# Patient Record
Sex: Female | Born: 1985
Health system: Southern US, Community
[De-identification: ages and names within clinical notes are randomized; demographics above are authoritative.]

## PROBLEM LIST (undated history)

## (undated) DIAGNOSIS — N809 Endometriosis, unspecified: Secondary | ICD-10-CM

## (undated) HISTORY — PX: DENTAL SURGERY: SHX609

---
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
## 2006-11-18 ENCOUNTER — Other Ambulatory Visit: Admission: RE | Admit: 2006-11-18 | Discharge: 2006-11-18 | Payer: Self-pay | Admitting: Gynecology

## 2010-08-03 NOTE — Procedures (Signed)
NAME:  Alexandra Burnett, Alexandra Burnett              ACCOUNT NO.:  [id number]   MEDICAL RECORD NO.:  [id number]          PATIENT TYPE:  OUT   LOCATION:  SLEEP CENTER                 FACILITY:  [facility]   PHYSICIAN:  Clinton D. Maple Hudson, M.D. DATE OF BIRTH:  1985/05/24   DATE OF ADMISSION:  10/04/2003  DATE OF DISCHARGE:  10/04/2003                              NOCTURNAL POLYSOMNOGRAM   REFERRING PHYSICIAN:  Dr. Hermelinda Medicus.   INDICATION FOR STUDY/HISTORY:  Hypersomnia with sleep apnea.  Reports of  tonsil hypertrophy.  Wakes fatigued, postnasal drainage and cough,  difficulty initiating sleep, excessive daytime sleepiness.  Epworth  Sleepiness Score 12/24.  BMI 19.2, weight 98 pounds.  Listed medication:  Oral contraceptive.   SLEEP ARCHITECTURE:  Total sleep time 376 minutes with sleep efficiency of  73%.  Stage I was 7%, stage II 68%, stage III and IV 11%.  REM was 14% of  total sleep.  Sleep latency 119 minutes.  REM latency 188.5 minutes.  Wake  after sleep onset 21 minutes.  Arousal index 8.6 times per hour.   RESPIRATORY DATA:  Occasional sleep disordered breathing including 13  hypopneas, RDI of 2 per hour, which is within normal limits and unlikely to  indicate a clinically significant sleep disordered breathing.  Most sleep  and most events while sleeping on left side.  REM RDI was 0 per hour.   OXYGEN DATA:  No snoring noted by technician, who reported she had to sit up  a couple of times to catch her breath before she feel asleep.  Oxygen  saturation nadir was 87% with mean oxygen saturation during sleep 98% and  room air baseline oxygen saturations while awake 98%.   CARDIAC DATA:  Normal cardiac rhythm.   MOVEMENT/PARASOMNIA:  Occasional leg jerks caused a few arousals,  insignificant.   IMPRESSION/RECOMMENDATION:  Occasional sleep disordered breathing events  with a respiratory disturbance index of 2 per hour, unlikely to be  clinically significant.  Oxygen saturation was generally  well-maintained.  A  few arousals were associated with leg jerks                                   ______________________________                                Rennis Chris. Maple Hudson, M.D.                                Diplomate, American Board of Sleep Medicine    CDY/MEDQ  D:  10/09/2003 12:40:09  T:  10/10/2003 11:16:00  Job:  [id number]

## 2011-12-06 ENCOUNTER — Encounter: Payer: Self-pay | Admitting: Internal Medicine

## 2016-02-17 ENCOUNTER — Emergency Department (HOSPITAL_COMMUNITY)
Admission: EM | Admit: 2016-02-17 | Discharge: 2016-02-17 | Disposition: A | Discharge status: Home / Self Care | Payer: BLUE CROSS/BLUE SHIELD | Attending: Emergency Medicine | Admitting: Emergency Medicine

## 2016-02-17 ENCOUNTER — Emergency Department (HOSPITAL_COMMUNITY): Payer: BLUE CROSS/BLUE SHIELD

## 2016-02-17 ENCOUNTER — Encounter (HOSPITAL_COMMUNITY): Payer: Self-pay | Admitting: Emergency Medicine

## 2016-02-17 DIAGNOSIS — W228XXA Striking against or struck by other objects, initial encounter: Secondary | ICD-10-CM | POA: Insufficient documentation

## 2016-02-17 DIAGNOSIS — Y999 Unspecified external cause status: Secondary | ICD-10-CM | POA: Diagnosis not present

## 2016-02-17 DIAGNOSIS — W19XXXA Unspecified fall, initial encounter: Secondary | ICD-10-CM

## 2016-02-17 DIAGNOSIS — F17200 Nicotine dependence, unspecified, uncomplicated: Secondary | ICD-10-CM | POA: Diagnosis not present

## 2016-02-17 DIAGNOSIS — S0990XA Unspecified injury of head, initial encounter: Secondary | ICD-10-CM | POA: Diagnosis present

## 2016-02-17 DIAGNOSIS — S0101XA Laceration without foreign body of scalp, initial encounter: Secondary | ICD-10-CM | POA: Insufficient documentation

## 2016-02-17 DIAGNOSIS — Y939 Activity, unspecified: Secondary | ICD-10-CM | POA: Diagnosis not present

## 2016-02-17 DIAGNOSIS — Y92480 Sidewalk as the place of occurrence of the external cause: Secondary | ICD-10-CM | POA: Insufficient documentation

## 2016-02-17 HISTORY — DX: Endometriosis, unspecified: N80.9

## 2016-02-17 MED ORDER — SODIUM CHLORIDE 0.9 % IV BOLUS (SEPSIS)
1000.0000 mL | Freq: Once | INTRAVENOUS | Status: AC
  Administered 2016-02-17: 1000 mL via INTRAVENOUS

## 2016-02-17 MED ORDER — LIDOCAINE-EPINEPHRINE 1 %-1:100000 IJ SOLN
10.0000 mL | Freq: Once | INTRAMUSCULAR | Status: AC
  Administered 2016-02-17: 1 mL via INTRADERMAL

## 2016-02-17 MED ORDER — ACETAMINOPHEN 500 MG PO TABS
1000.0000 mg | ORAL_TABLET | Freq: Once | ORAL | Status: AC
  Administered 2016-02-17: 1000 mg via ORAL

## 2016-02-17 NOTE — ED Provider Notes (Signed)
[facility]-EMERGENCY DEPT Provider Note   CSN: [id number] Arrival date & time: 02/17/16  0151  By signing my name below, I, Suzan SlickAshley N. Elon SpannerLeger, attest that this documentation has been prepared under the direction and in the presence of Alvira MondayErin Brynnly Bonet, MD.  Electronically Signed: Suzan SlickAshley N. Elon SpannerLeger, ED Scribe. 02/17/16. 3:06 AM.    History   Chief Complaint Chief Complaint  Patient presents with  . Loss of Consciousness  . Head Injury   The history is provided by the patient. No language interpreter was used.    HPI Comments: Alexandra Burnett, brought in by EMS is a 30 y.o. female without any pertinent past medical history who presents to the Emergency Department here after a fall sustained just prior to arrival. Pt states she does not recall events from the fall. However, EMS states pt fell on her front porch after drinking heavily this evening. Last thing pt remembers is being at the bar with her friends.  Boyfriend suspects she lost balance trying to open door and fell down one step.  She was found on the pavement in front of her house. She now reports constant, unchanged neck pain. Laceration also reported to the lateral head. Bleeding controlled at this time. No interventions given en route to department. No recent fever, chills, nausea, or vomiting. No numbness, loss of sensation, or weakness.  [facility]    Past Medical History:  Diagnosis Date  . Endometriosis     There are no active problems to display for this patient.   Past Surgical History:  Procedure Laterality Date  . DENTAL SURGERY      OB History    No data available       Home Medications    Prior to Admission medications   Medication Sig Start Date End Date Taking? Authorizing Provider  PRESCRIPTION MEDICATION Take 1 tablet by mouth daily. Birth control   Yes Historical Provider, MD  spironolactone (ALDACTONE) 25 MG tablet Take 25 mg by mouth 2 (two) times daily.   Yes Historical  Provider, MD    Family History History reviewed. No pertinent family history.  Social History Social History  Substance Use Topics  . Smoking status: Current Some Day Smoker  . Smokeless tobacco: Never Used  . Alcohol use Yes     Allergies   Other   Review of Systems Review of Systems  Constitutional: Negative for chills and fever.  Respiratory: Negative for cough and shortness of breath.   Cardiovascular: Negative for chest pain.  Gastrointestinal: Negative for nausea and vomiting.  Musculoskeletal: Positive for neck pain.  Skin: Positive for wound.  Neurological: Negative for weakness and numbness.  All other systems reviewed and are negative.    Physical Exam Updated Vital Signs BP 110/88   Pulse 108   Temp 97.5 F (36.4 C) (Oral)   Resp 26   Ht 5' (1.524 m)   Wt 107 lb (48.5 kg)   LMP 02/03/2016 (Approximate) Comment: Periods irregular.   SpO2 100%   BMI 20.90 kg/m   Physical Exam  Constitutional: She is oriented to person, place, and time. She appears well-developed and well-nourished. No distress.  HENT:  Head: Normocephalic. Head is with laceration. Head is without raccoon's eyes and without Battle's sign.  Nose: No rhinorrhea.  4 cm lateral laceration to the R parietal area. No CSF.  Eyes: EOM are normal.  Neck: Normal range of motion.  Cardiovascular: Normal rate, regular rhythm and normal heart sounds.  Pulmonary/Chest: Effort normal and breath sounds normal.  Abdominal: Soft. She exhibits no distension. There is no tenderness.  Musculoskeletal: Normal range of motion. She exhibits tenderness.  Midline cervical spine tenderness.  Neurological: She is alert and oriented to person, place, and time.  Skin: Skin is warm and dry.  Psychiatric: She has a normal mood and affect. Judgment normal.  Nursing note and vitals reviewed.    ED Treatments / Results   DIAGNOSTIC STUDIES: Oxygen Saturation is 100% on RA, Normal by my interpretation.     COORDINATION OF CARE: 3:03 AM- Will order imaging. Discussed treatment plan with pt at bedside and pt agreed to plan.     Labs (all labs ordered are listed, but only abnormal results are displayed) Labs Reviewed - No data to display  EKG  EKG Interpretation None       Radiology Ct Head Wo Contrast  Result Date: 02/17/2016 CLINICAL DATA:  Fall, hit head on concrete, pain EXAM: CT HEAD WITHOUT CONTRAST CT CERVICAL SPINE WITHOUT CONTRAST TECHNIQUE: Multidetector CT imaging of the head and cervical spine was performed following the standard protocol without intravenous contrast. Multiplanar CT image reconstructions of the cervical spine were also generated. COMPARISON:  None. FINDINGS: CT HEAD FINDINGS Brain: No evidence of acute infarction, hemorrhage, hydrocephalus, extra-axial collection or mass lesion/mass effect. Vascular: No hyperdense vessel or unexpected calcification. Skull: Normal. Negative for fracture or focal lesion. Sinuses/Orbits: No acute finding. Other: Right posterior parietal scalp laceration. CT CERVICAL SPINE FINDINGS Alignment: No subluxation.  Facet alignment is maintained. Skull base and vertebrae: Craniovertebral junction is intact. Vertebral body heights are maintained. No fracture. Soft tissues and spinal canal: No prevertebral fluid or swelling. No visible canal hematoma. Disc levels:  No significant disc disease. Upper chest: Lung apices clear.  Thyroid normal Other: None IMPRESSION: No CT evidence for acute intracranial abnormality. Right posterior parietal scalp laceration. No CT evidence for acute osseous abnormality of the cervical spine Electronically Signed   By: Jasmine Pang M.D.   On: 02/17/2016 04:45   Ct Cervical Spine Wo Contrast  Result Date: 02/17/2016 CLINICAL DATA:  Fall, hit head on concrete, pain EXAM: CT HEAD WITHOUT CONTRAST CT CERVICAL SPINE WITHOUT CONTRAST TECHNIQUE: Multidetector CT imaging of the head and cervical spine was performed  following the standard protocol without intravenous contrast. Multiplanar CT image reconstructions of the cervical spine were also generated. COMPARISON:  None. FINDINGS: CT HEAD FINDINGS Brain: No evidence of acute infarction, hemorrhage, hydrocephalus, extra-axial collection or mass lesion/mass effect. Vascular: No hyperdense vessel or unexpected calcification. Skull: Normal. Negative for fracture or focal lesion. Sinuses/Orbits: No acute finding. Other: Right posterior parietal scalp laceration. CT CERVICAL SPINE FINDINGS Alignment: No subluxation.  Facet alignment is maintained. Skull base and vertebrae: Craniovertebral junction is intact. Vertebral body heights are maintained. No fracture. Soft tissues and spinal canal: No prevertebral fluid or swelling. No visible canal hematoma. Disc levels:  No significant disc disease. Upper chest: Lung apices clear.  Thyroid normal Other: None IMPRESSION: No CT evidence for acute intracranial abnormality. Right posterior parietal scalp laceration. No CT evidence for acute osseous abnormality of the cervical spine Electronically Signed   By: Jasmine Pang M.D.   On: 02/17/2016 04:45    Procedures .Marland KitchenLaceration Repair Date/Time: 02/17/2016 4:56 PM Performed by: Alvira Monday Authorized by: Alvira Monday   Consent:    Consent obtained:  Verbal   Risks discussed:  Infection and pain Laceration details:    Location:  Scalp   Scalp location:  R parietal   Length (cm):  4 Repair type:    Repair type:  Simple Pre-procedure details:    Preparation:  Patient was prepped and draped in usual sterile fashion and imaging obtained to evaluate for foreign bodies Exploration:    Contaminated: no   Treatment:    Area cleansed with:  Saline (chloraprep)   Amount of cleaning:  Standard   Irrigation solution:  Sterile saline   Irrigation volume:  1L   Irrigation method:  Syringe Skin repair:    Repair method:  Staples   Number of staples:  7 Post-procedure  details:    Dressing:  Open (no dressing)   Patient tolerance of procedure:  Tolerated well, no immediate complications   (including critical care time)  Medications Ordered in ED Medications  sodium chloride 0.9 % bolus 1,000 mL (0 mLs Intravenous Stopped 02/17/16 0530)  lidocaine-EPINEPHrine (XYLOCAINE W/EPI) 1 %-1:100000 (with pres) injection 10 mL (1 mL Intradermal Given 02/17/16 0630)  acetaminophen (TYLENOL) tablet 1,000 mg (1,000 mg Oral Given 02/17/16 0630)     Initial Impression / Assessment and Plan / ED Course  I have reviewed the triage vital signs and the nursing notes.  Pertinent labs & imaging results that were available during my care of the patient were reviewed by me and considered in my medical decision making (see chart for details).  Clinical Course     30yo female with hx of binge drinking presents with concern for fall with LOC just prior to arrival and head laceration. CT head and cervical spine WNL. Laceration irrigated, repaired with staples.  Pt without other injuries. Given fluids, pt stable for discharge. Patient discharged in stable condition with understanding of reasons to return.   Final Clinical Impressions(s) / ED Diagnoses   Final diagnoses:  Fall, initial encounter  Laceration of scalp, initial encounter    New Prescriptions Discharge Medication List as of 02/17/2016  6:23 AM     I personally performed the services described in this documentation, which was scribed in my presence. The recorded information has been reviewed and is accurate.    Alvira MondayErin Fateh Kindle, MD 02/17/16 [phone]

## 2016-02-17 NOTE — ED Notes (Signed)
Pt ambulatory to bathroom with steady gait. Denied dizziness during ambulation

## 2016-02-17 NOTE — ED Triage Notes (Signed)
Arrives via EMS after falling on front porch after drinking heavily tonight. Last thing patient remembers was being at the bar with friends. Per boyfriend, patient called him and explained that she had taken an [employer] home and then patient stated "oh shit" over the phone; afterward phone went silent. Boyfriend went to house and found patient lying on pavement. EMS reported small amount of blood on pavement. Laceration to right posterior lateral head. Bleeding controlled at this time.

## 2016-02-17 NOTE — ED Notes (Signed)
Lido at bedside

## 2016-02-17 NOTE — ED Notes (Signed)
Patient ambulated to restroom with this RN and Boyfriend. Gait steady. Denies dizziness. After returning, CT transporter arrived.

## 2016-02-17 NOTE — ED Notes (Signed)
Pt verbalized understanding of d/c instructions and has no further questions. Pt is stable, A&Ox4, VSS.  

## 2017-06-11 DIAGNOSIS — R69 Illness, unspecified: Secondary | ICD-10-CM | POA: Diagnosis not present

## 2017-08-07 DIAGNOSIS — Z3041 Encounter for surveillance of contraceptive pills: Secondary | ICD-10-CM | POA: Diagnosis not present

## 2017-08-07 DIAGNOSIS — Z01419 Encounter for gynecological examination (general) (routine) without abnormal findings: Secondary | ICD-10-CM | POA: Diagnosis not present

## 2017-08-07 DIAGNOSIS — Z1389 Encounter for screening for other disorder: Secondary | ICD-10-CM | POA: Diagnosis not present

## 2017-08-07 DIAGNOSIS — R69 Illness, unspecified: Secondary | ICD-10-CM | POA: Diagnosis not present

## 2017-08-07 DIAGNOSIS — N803 Endometriosis of pelvic peritoneum: Secondary | ICD-10-CM | POA: Diagnosis not present

## 2017-08-07 DIAGNOSIS — Z130 Encounter for screening for diseases of the blood and blood-forming organs and certain disorders involving the immune mechanism: Secondary | ICD-10-CM | POA: Diagnosis not present

## 2017-08-07 DIAGNOSIS — Z6820 Body mass index (BMI) 20.0-20.9, adult: Secondary | ICD-10-CM | POA: Diagnosis not present

## 2017-08-15 DIAGNOSIS — L249 Irritant contact dermatitis, unspecified cause: Secondary | ICD-10-CM | POA: Diagnosis not present

## 2017-10-07 DIAGNOSIS — M79672 Pain in left foot: Secondary | ICD-10-CM | POA: Diagnosis not present

## 2017-12-25 DIAGNOSIS — D2262 Melanocytic nevi of left upper limb, including shoulder: Secondary | ICD-10-CM | POA: Diagnosis not present

## 2017-12-25 DIAGNOSIS — D2371 Other benign neoplasm of skin of right lower limb, including hip: Secondary | ICD-10-CM | POA: Diagnosis not present

## 2017-12-25 DIAGNOSIS — D224 Melanocytic nevi of scalp and neck: Secondary | ICD-10-CM | POA: Diagnosis not present

## 2017-12-25 DIAGNOSIS — L821 Other seborrheic keratosis: Secondary | ICD-10-CM | POA: Diagnosis not present

## 2017-12-25 DIAGNOSIS — D2261 Melanocytic nevi of right upper limb, including shoulder: Secondary | ICD-10-CM | POA: Diagnosis not present

## 2017-12-25 DIAGNOSIS — L720 Epidermal cyst: Secondary | ICD-10-CM | POA: Diagnosis not present

## 2017-12-25 DIAGNOSIS — D225 Melanocytic nevi of trunk: Secondary | ICD-10-CM | POA: Diagnosis not present

## 2017-12-25 DIAGNOSIS — D2222 Melanocytic nevi of left ear and external auricular canal: Secondary | ICD-10-CM | POA: Diagnosis not present

## 2017-12-25 DIAGNOSIS — D2272 Melanocytic nevi of left lower limb, including hip: Secondary | ICD-10-CM | POA: Diagnosis not present

## 2017-12-25 DIAGNOSIS — D2271 Melanocytic nevi of right lower limb, including hip: Secondary | ICD-10-CM | POA: Diagnosis not present

## 2018-01-29 DIAGNOSIS — R69 Illness, unspecified: Secondary | ICD-10-CM | POA: Diagnosis not present

## 2018-02-19 DIAGNOSIS — R69 Illness, unspecified: Secondary | ICD-10-CM | POA: Diagnosis not present

## 2018-04-22 DIAGNOSIS — J019 Acute sinusitis, unspecified: Secondary | ICD-10-CM | POA: Diagnosis not present

## 2018-05-05 DIAGNOSIS — R05 Cough: Secondary | ICD-10-CM | POA: Diagnosis not present

## 2018-05-29 DIAGNOSIS — R69 Illness, unspecified: Secondary | ICD-10-CM | POA: Diagnosis not present

## 2018-05-29 DIAGNOSIS — J0100 Acute maxillary sinusitis, unspecified: Secondary | ICD-10-CM | POA: Diagnosis not present

## 2018-05-29 DIAGNOSIS — R05 Cough: Secondary | ICD-10-CM | POA: Diagnosis not present

## 2018-06-03 IMAGING — CT CT CERVICAL SPINE W/O CM
5 of 8 series · 11 of 33 positions shown, 12 images · non-contrast
Comparison: None.

CLINICAL DATA: Fall, hit head on concrete, pain

EXAM:
CT HEAD WITHOUT CONTRAST
CT CERVICAL SPINE WITHOUT CONTRAST
TECHNIQUE: Multidetector CT imaging of the head and cervical spine was
performed following the standard protocol without intravenous
contrast. Multiplanar CT image reconstructions of the cervical spine
were also generated.

[Series 3: head bone · axial · 0.41mm/px · z∈[-16,+38]mm · 2 of 82 slices shown]
[im 28/82  bone]
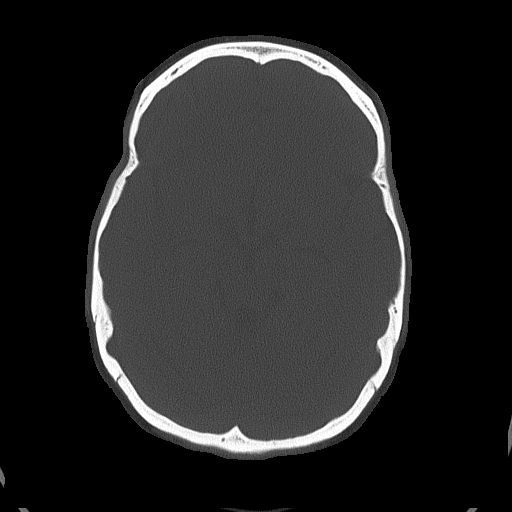
[im 55/82  bone]
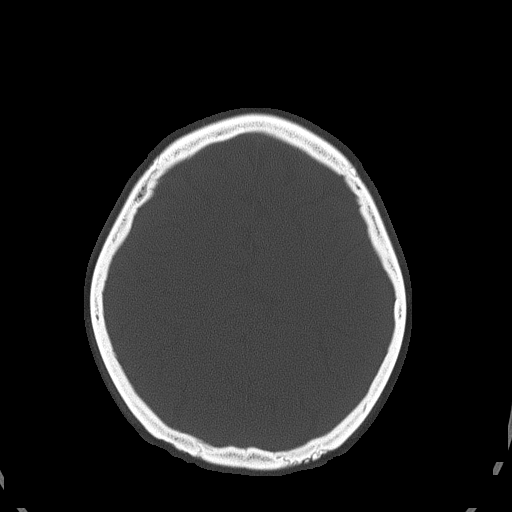

[Series 6: c_spine 2.0 st · axial · 0.25mm/px · z∈[-164,-114]mm · 2 of 77 slices shown, 3 images]
[im 26/77  soft-tissue]
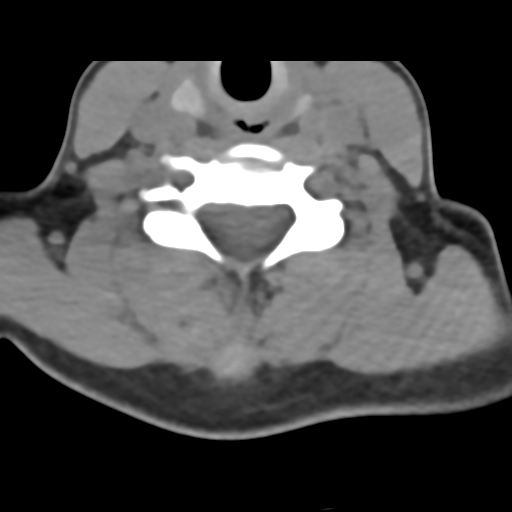
[im 26/77  bone]
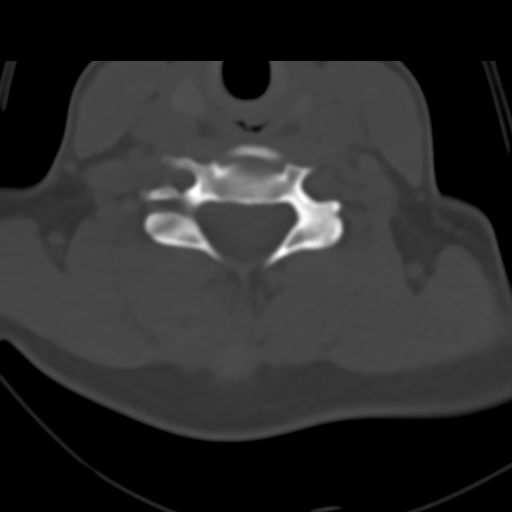
[im 51/77  bone]
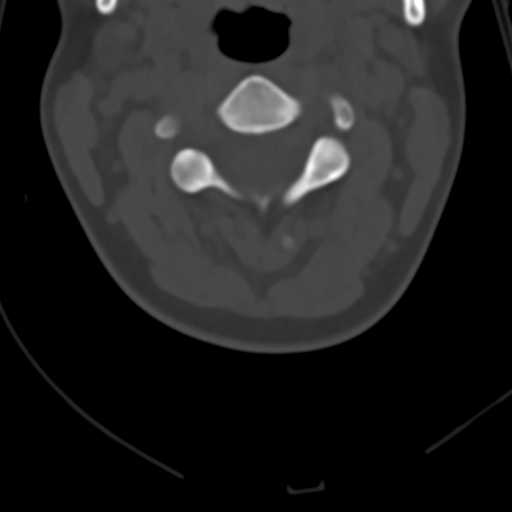

[Series 10: c_spine 2.0 sag bone · sagittal · 0.23mm/px · 4 of 61 slices shown]
[im 13/61  bone]
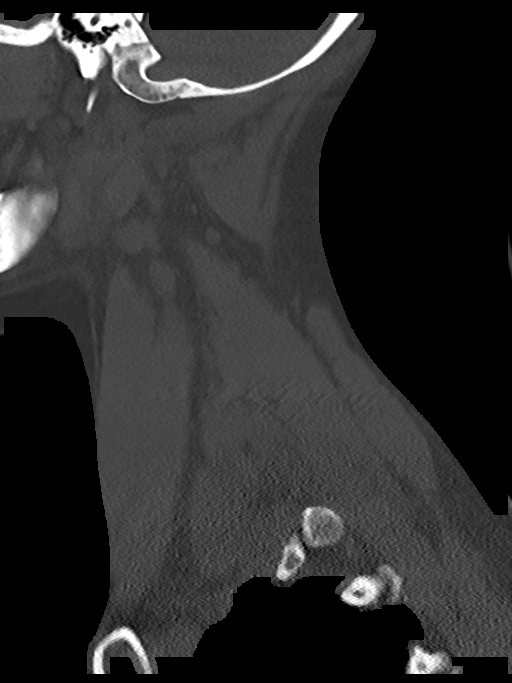
[im 25/61  bone]
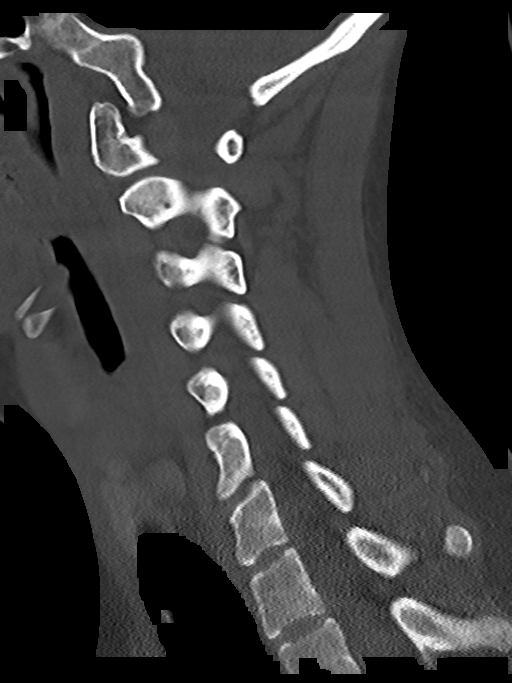
[im 37/61  bone]
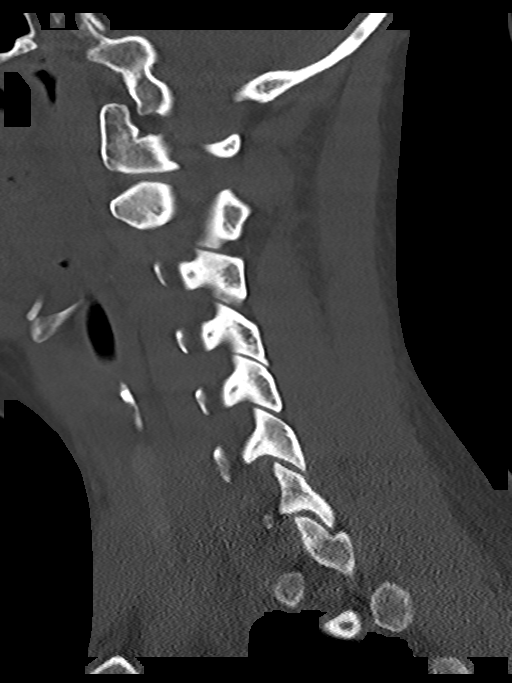
[im 49/61  bone]
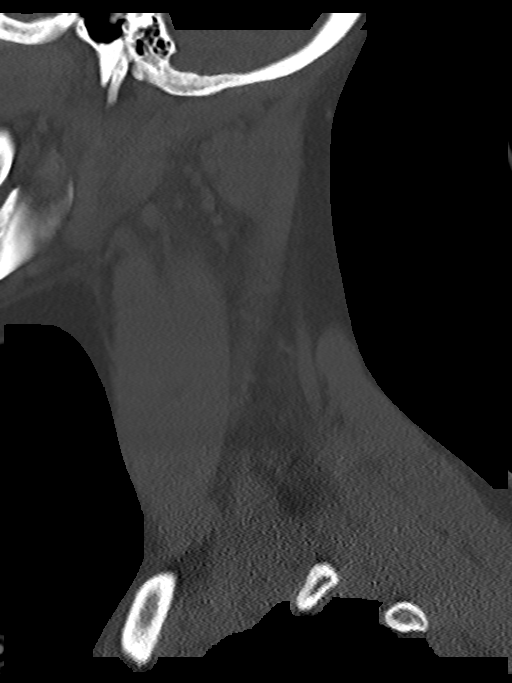

[Series 11: c_spine 2.0 cor bone · coronal · 0.23mm/px · 1 of 61 slices shown]
[im 31/61  bone]
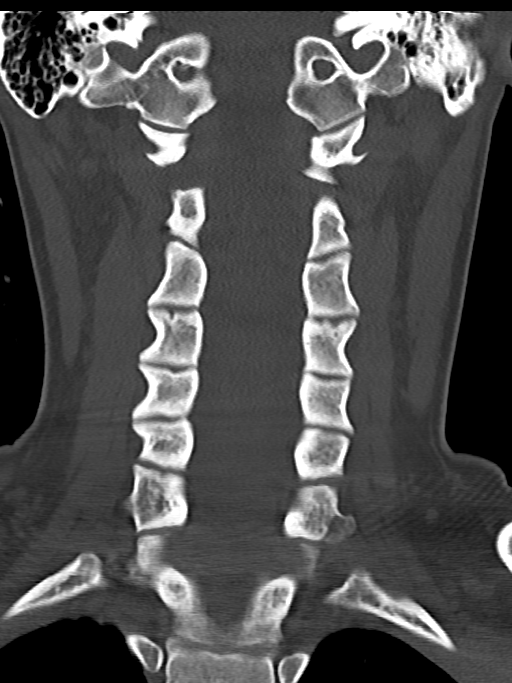

[Series 13: c_spine 2.0 orthogonals · axial · 0.21mm/px · z∈[-178,-130]mm · 2 of 81 slices shown]
[im 27/81  bone]
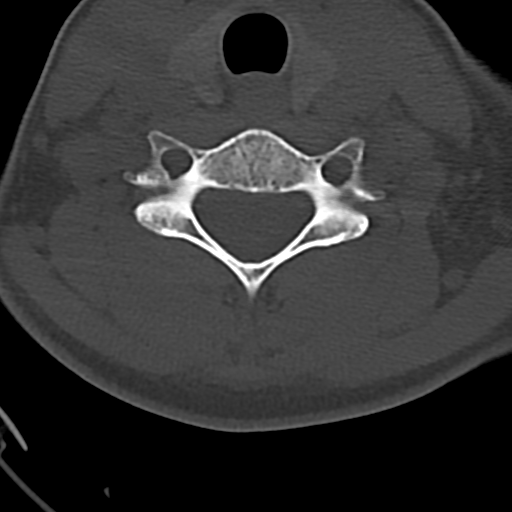
[im 54/81  bone]
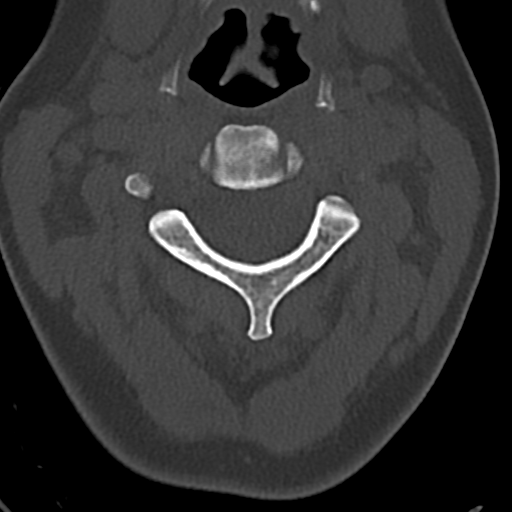

[11 of 33 positions shown; findings below may reference images not displayed]

FINDINGS: CT HEAD FINDINGS

Brain: No evidence of acute infarction, hemorrhage, hydrocephalus,
extra-axial collection or mass lesion/mass effect.

Vascular: No hyperdense vessel or unexpected calcification.

Skull: Normal. Negative for fracture or focal lesion.

Sinuses/Orbits: No acute finding.

Other: Right posterior parietal scalp laceration.

CT CERVICAL SPINE FINDINGS

Alignment: No subluxation.  Facet alignment is maintained.

Skull base and vertebrae: Craniovertebral junction is intact.
Vertebral body heights are maintained. No fracture.

Soft tissues and spinal canal: No prevertebral fluid or swelling. No
visible canal hematoma.

Disc levels:  No significant disc disease.

Upper chest: Lung apices clear.  Thyroid normal

Other: None
IMPRESSION: No CT evidence for acute intracranial abnormality. Right posterior
parietal scalp laceration.

No CT evidence for acute osseous abnormality of the cervical spine

## 2018-11-30 DIAGNOSIS — L700 Acne vulgaris: Secondary | ICD-10-CM | POA: Diagnosis not present

## 2018-11-30 DIAGNOSIS — R69 Illness, unspecified: Secondary | ICD-10-CM | POA: Diagnosis not present

## 2018-12-03 DIAGNOSIS — Z3041 Encounter for surveillance of contraceptive pills: Secondary | ICD-10-CM | POA: Diagnosis not present

## 2018-12-03 DIAGNOSIS — Z6820 Body mass index (BMI) 20.0-20.9, adult: Secondary | ICD-10-CM | POA: Diagnosis not present

## 2018-12-03 DIAGNOSIS — Z124 Encounter for screening for malignant neoplasm of cervix: Secondary | ICD-10-CM | POA: Diagnosis not present

## 2018-12-03 DIAGNOSIS — R69 Illness, unspecified: Secondary | ICD-10-CM | POA: Diagnosis not present

## 2018-12-03 DIAGNOSIS — Z130 Encounter for screening for diseases of the blood and blood-forming organs and certain disorders involving the immune mechanism: Secondary | ICD-10-CM | POA: Diagnosis not present

## 2018-12-03 DIAGNOSIS — Z1389 Encounter for screening for other disorder: Secondary | ICD-10-CM | POA: Diagnosis not present

## 2018-12-03 DIAGNOSIS — Z8742 Personal history of other diseases of the female genital tract: Secondary | ICD-10-CM | POA: Diagnosis not present

## 2018-12-03 DIAGNOSIS — Z01419 Encounter for gynecological examination (general) (routine) without abnormal findings: Secondary | ICD-10-CM | POA: Diagnosis not present

## 2019-01-07 DIAGNOSIS — I788 Other diseases of capillaries: Secondary | ICD-10-CM | POA: Diagnosis not present

## 2019-01-07 DIAGNOSIS — D2261 Melanocytic nevi of right upper limb, including shoulder: Secondary | ICD-10-CM | POA: Diagnosis not present

## 2019-01-07 DIAGNOSIS — D2272 Melanocytic nevi of left lower limb, including hip: Secondary | ICD-10-CM | POA: Diagnosis not present

## 2019-01-07 DIAGNOSIS — D225 Melanocytic nevi of trunk: Secondary | ICD-10-CM | POA: Diagnosis not present

## 2019-01-07 DIAGNOSIS — D224 Melanocytic nevi of scalp and neck: Secondary | ICD-10-CM | POA: Diagnosis not present

## 2019-01-07 DIAGNOSIS — L700 Acne vulgaris: Secondary | ICD-10-CM | POA: Diagnosis not present

## 2019-01-07 DIAGNOSIS — D2271 Melanocytic nevi of right lower limb, including hip: Secondary | ICD-10-CM | POA: Diagnosis not present

## 2019-01-07 DIAGNOSIS — L812 Freckles: Secondary | ICD-10-CM | POA: Diagnosis not present

## 2019-01-07 DIAGNOSIS — D2262 Melanocytic nevi of left upper limb, including shoulder: Secondary | ICD-10-CM | POA: Diagnosis not present

## 2019-01-07 DIAGNOSIS — D2371 Other benign neoplasm of skin of right lower limb, including hip: Secondary | ICD-10-CM | POA: Diagnosis not present

## 2019-04-29 ENCOUNTER — Ambulatory Visit: Payer: 59 | Attending: Internal Medicine

## 2019-04-29 DIAGNOSIS — Z23 Encounter for immunization: Secondary | ICD-10-CM | POA: Insufficient documentation

## 2019-04-29 NOTE — Progress Notes (Signed)
   Covid-19 Vaccination Clinic  Name:  Alexandra Burnett    MRN: [id number] DOB: 1985/11/01  04/29/2019  [name] was observed post Covid-19 immunization for 15 minutes without incidence. She was provided with Vaccine Information Sheet and instruction to access the V-Safe system.   [name] was instructed to call 911 with any severe reactions post vaccine: Marland Kitchen Difficulty breathing  . Swelling of your face and throat  . A fast heartbeat  . A bad rash all over your body  . Dizziness and weakness    Immunizations Administered    Name Date Dose VIS Date Route   Pfizer COVID-19 Vaccine 04/29/2019  3:53 PM 0.3 mL 02/26/2019 Intramuscular   Manufacturer: [employer], [employer]   Lot: [id number]   NDC: [address]-3

## 2019-05-24 ENCOUNTER — Ambulatory Visit: Payer: 59 | Attending: Internal Medicine

## 2019-05-24 DIAGNOSIS — Z23 Encounter for immunization: Secondary | ICD-10-CM | POA: Insufficient documentation

## 2019-05-24 NOTE — Progress Notes (Signed)
   Covid-19 Vaccination Clinic  Name:  Alexandra Burnett    MRN: [id number] DOB: 1985-10-06  05/24/2019  [name] was observed post Covid-19 immunization for 15 minutes without incident. She was provided with Vaccine Information Sheet and instruction to access the V-Safe system.   [name] was instructed to call 911 with any severe reactions post vaccine: Marland Kitchen Difficulty breathing  . Swelling of face and throat  . A fast heartbeat  . A bad rash all over body  . Dizziness and weakness   Immunizations Administered    Name Date Dose VIS Date Route   Pfizer COVID-19 Vaccine 05/24/2019  3:55 PM 0.3 mL 02/26/2019 Intramuscular   Manufacturer: [employer], [employer]   Lot: [id number]   NDC: [address]-8
